# Patient Record
Sex: Male | Born: 1935 | Race: White | Hispanic: No | Marital: Married | Smoking: Never smoker
Health system: Southern US, Community
[De-identification: ages and names within clinical notes are randomized; demographics above are authoritative.]

## PROBLEM LIST (undated history)

## (undated) DIAGNOSIS — I10 Essential (primary) hypertension: Secondary | ICD-10-CM

## (undated) DIAGNOSIS — N400 Enlarged prostate without lower urinary tract symptoms: Secondary | ICD-10-CM

## (undated) DIAGNOSIS — J329 Chronic sinusitis, unspecified: Secondary | ICD-10-CM

## (undated) DIAGNOSIS — I4891 Unspecified atrial fibrillation: Secondary | ICD-10-CM

## (undated) HISTORY — PX: CHOLECYSTECTOMY: SHX55

## (undated) HISTORY — PX: HERNIA REPAIR: SHX51

---

## 2013-12-15 ENCOUNTER — Encounter (HOSPITAL_BASED_OUTPATIENT_CLINIC_OR_DEPARTMENT_OTHER): Payer: Self-pay

## 2013-12-15 ENCOUNTER — Emergency Department (HOSPITAL_BASED_OUTPATIENT_CLINIC_OR_DEPARTMENT_OTHER)
Admission: EM | Admit: 2013-12-15 | Discharge: 2013-12-15 | Disposition: A | Discharge status: Home / Self Care | Payer: Medicare HMO | Attending: Emergency Medicine | Admitting: Emergency Medicine

## 2013-12-15 ENCOUNTER — Emergency Department (HOSPITAL_BASED_OUTPATIENT_CLINIC_OR_DEPARTMENT_OTHER): Payer: Medicare HMO

## 2013-12-15 DIAGNOSIS — W01198A Fall on same level from slipping, tripping and stumbling with subsequent striking against other object, initial encounter: Secondary | ICD-10-CM | POA: Insufficient documentation

## 2013-12-15 DIAGNOSIS — S301XXA Contusion of abdominal wall, initial encounter: Secondary | ICD-10-CM | POA: Diagnosis not present

## 2013-12-15 DIAGNOSIS — Z880 Allergy status to penicillin: Secondary | ICD-10-CM | POA: Diagnosis not present

## 2013-12-15 DIAGNOSIS — Y92015 Private garage of single-family (private) house as the place of occurrence of the external cause: Secondary | ICD-10-CM | POA: Diagnosis not present

## 2013-12-15 DIAGNOSIS — S3991XA Unspecified injury of abdomen, initial encounter: Secondary | ICD-10-CM | POA: Diagnosis present

## 2013-12-15 DIAGNOSIS — Y998 Other external cause status: Secondary | ICD-10-CM | POA: Diagnosis not present

## 2013-12-15 DIAGNOSIS — I10 Essential (primary) hypertension: Secondary | ICD-10-CM | POA: Insufficient documentation

## 2013-12-15 DIAGNOSIS — Z7901 Long term (current) use of anticoagulants: Secondary | ICD-10-CM | POA: Diagnosis not present

## 2013-12-15 DIAGNOSIS — W19XXXA Unspecified fall, initial encounter: Secondary | ICD-10-CM

## 2013-12-15 DIAGNOSIS — Y9389 Activity, other specified: Secondary | ICD-10-CM | POA: Diagnosis not present

## 2013-12-15 HISTORY — DX: Unspecified atrial fibrillation: I48.91

## 2013-12-15 HISTORY — DX: Essential (primary) hypertension: I10

## 2013-12-15 LAB — BASIC METABOLIC PANEL
Anion gap: 12 (ref 5–15)
BUN: 21 mg/dL (ref 6–23)
CALCIUM: 8.9 mg/dL (ref 8.4–10.5)
CO2: 23 mEq/L (ref 19–32)
CREATININE: 1.4 mg/dL — AB (ref 0.50–1.35)
Chloride: 103 mEq/L (ref 96–112)
GFR calc non Af Amer: 43 mL/min — ABNORMAL LOW (ref >90)
GFR, EST AFRICAN AMERICAN: 50 mL/min — AB (ref >90)
Glucose, Bld: 139 mg/dL — ABNORMAL HIGH (ref 70–99)
Potassium: 4.7 mEq/L (ref 3.7–5.3)
Sodium: 138 mEq/L (ref 137–147)

## 2013-12-15 LAB — CBC WITH DIFFERENTIAL/PLATELET
BASOS PCT: 0 % (ref 0–1)
Basophils Absolute: 0 10*3/uL (ref 0.0–0.1)
EOS ABS: 0 10*3/uL (ref 0.0–0.7)
Eosinophils Relative: 0 % (ref 0–5)
HEMATOCRIT: 35.3 % — AB (ref 39.0–52.0)
Hemoglobin: 12.3 g/dL — ABNORMAL LOW (ref 13.0–17.0)
Lymphocytes Relative: 5 % — ABNORMAL LOW (ref 12–46)
Lymphs Abs: 0.4 10*3/uL — ABNORMAL LOW (ref 0.7–4.0)
MCH: 36.1 pg — AB (ref 26.0–34.0)
MCHC: 34.8 g/dL (ref 30.0–36.0)
MCV: 103.5 fL — ABNORMAL HIGH (ref 78.0–100.0)
MONO ABS: 0.6 10*3/uL (ref 0.1–1.0)
Monocytes Relative: 9 % (ref 3–12)
NEUTROS PCT: 86 % — AB (ref 43–77)
Neutro Abs: 5.8 10*3/uL (ref 1.7–7.7)
Platelets: 96 10*3/uL — ABNORMAL LOW (ref 150–400)
RBC: 3.41 MIL/uL — ABNORMAL LOW (ref 4.22–5.81)
RDW: 13.3 % (ref 11.5–15.5)
WBC: 6.8 10*3/uL (ref 4.0–10.5)

## 2013-12-15 LAB — PROTIME-INR
INR: 2.61 — ABNORMAL HIGH (ref 0.00–1.49)
Prothrombin Time: 27.9 seconds — ABNORMAL HIGH (ref 11.6–15.2)

## 2013-12-15 MED ORDER — SODIUM CHLORIDE 0.9 % IV BOLUS (SEPSIS)
500.0000 mL | Freq: Once | INTRAVENOUS | Status: AC
  Administered 2013-12-15: 500 mL via INTRAVENOUS

## 2013-12-15 MED ORDER — IOHEXOL 300 MG/ML  SOLN
80.0000 mL | Freq: Once | INTRAMUSCULAR | Status: AC | PRN
  Administered 2013-12-15: 80 mL via INTRAVENOUS

## 2013-12-15 MED ORDER — IOHEXOL 300 MG/ML  SOLN: 100.0000 mL | Freq: Once | INTRAMUSCULAR | Status: DC | PRN

## 2013-12-15 MED ORDER — SODIUM CHLORIDE 0.9 % IV BOLUS (SEPSIS)
1000.0000 mL | Freq: Once | INTRAVENOUS | Status: AC
  Administered 2013-12-15: 1000 mL via INTRAVENOUS

## 2013-12-15 NOTE — ED Provider Notes (Signed)
CSN: [id number]     Arrival date & time 12/15/13  1811 History  This chart was scribed for Audree CamelScott T Elester Apodaca, MD by Gwenyth Oberatherine Macek, ED Scribe. This patient was seen in room MH02/MH02 and the patient's care was started at 7:06 PM.    Chief Complaint  Patient presents with  . Fall   The history is provided by the patient. No language interpreter was used.    HPI Comments: Kyle Wheeler is a 80 y.o. male who presents to the Emergency Department complaining of gradually improving, 3/10, left upper quadrant abdominal pain that started 3 hours ago. Pt was moving something when he lost his balance and fell in his garage onto his left side. He did not hit his head or lose consciousness. Pt takes Warfarin and came to ED per PCP's recommendation. His last coumadin measurement was 2.5 He denies hematuria as an associated symptom.  Past Medical History  Diagnosis Date  . Hypertension   . A-fib    Past Surgical History  Procedure Laterality Date  . Cholecystectomy    . Hernia repair     No family history on file. History  Substance Use Topics  . Smoking status: Never Smoker   . Smokeless tobacco: Not on file  . Alcohol Use: No    Review of Systems  Cardiovascular: Negative for chest pain.  Gastrointestinal: Positive for abdominal pain. Negative for vomiting.  Genitourinary: Negative for hematuria.  Musculoskeletal: Negative for back pain.  Skin: Negative for wound.  All other systems reviewed and are negative.   Allergies  Penicillins and Sulfa antibiotics  Home Medications   Prior to Admission medications   Medication Sig Start Date End Date Taking? Authorizing Provider  LORAZEPAM PO Take by mouth.   Yes Historical Provider, MD  UNKNOWN TO PATIENT BP meds x 2   Yes Historical Provider, MD  WARFARIN SODIUM PO Take by mouth.   Yes Historical Provider, MD   BP 161/74 mmHg  Pulse 96  Temp(Src) 98.3 F (36.8 C) (Oral)  Resp 18  Ht 5\' 8"  (1.727 m)  Wt 240 lb (108.863 kg)  BMI  36.50 kg/m2  SpO2 96% Physical Exam  Constitutional: He appears well-developed and well-nourished. No distress.  HENT:  Head: Normocephalic and atraumatic.  Neck: Neck supple. No tracheal deviation present.  Cardiovascular: Normal rate, regular rhythm and normal heart sounds.   Pulmonary/Chest: Effort normal and breath sounds normal. No respiratory distress. He exhibits no tenderness.  Abdominal: He exhibits no distension. There is no tenderness. There is no rebound and no guarding.  Musculoskeletal:  No focal abdominal tenderness; no ecchymosis; no tenderness around his ribs; no chest tenderness  Skin: Skin is warm and dry.  Psychiatric: He has a normal mood and affect. His behavior is normal.  Nursing note and vitals reviewed.   ED Course  Procedures (including critical care time) DIAGNOSTIC STUDIES: Oxygen Saturation is 96% on RA, normal by my interpretation.    COORDINATION OF CARE: 7:10 PM Discussed treatment plan which includes CT Abdomen/Pelvis and labwork and pt agreed to plan.  Labs Review Labs Reviewed  CBC WITH DIFFERENTIAL - Abnormal; Notable for the following:    RBC 3.41 (*)    Hemoglobin 12.3 (*)    HCT 35.3 (*)    MCV 103.5 (*)    MCH 36.1 (*)    Platelets 96 (*)    Neutrophils Relative % 86 (*)    Lymphocytes Relative 5 (*)    Lymphs Abs 0.4 (*)  All other components within normal limits  BASIC METABOLIC PANEL - Abnormal; Notable for the following:    Glucose, Bld 139 (*)    Creatinine, Ser 1.40 (*)    GFR calc non Af Amer 43 (*)    GFR calc Af Amer 50 (*)    All other components within normal limits  PROTIME-INR - Abnormal; Notable for the following:    Prothrombin Time 27.9 (*)    INR 2.61 (*)    All other components within normal limits    Imaging Review Ct Abdomen Pelvis W Contrast  12/15/2013   CLINICAL DATA:  Left upper quadrant abdominal pain after fall.  EXAM: CT ABDOMEN AND PELVIS WITH CONTRAST  TECHNIQUE: Multidetector CT imaging of  the abdomen and pelvis was performed using the standard protocol following bolus administration of intravenous contrast.  CONTRAST:  80mL OMNIPAQUE IOHEXOL 300 MG/ML  SOLN  COMPARISON:  None.  FINDINGS: Multilevel degenerative disc disease is noted in the lumbar spine. Visualized lung bases appear normal.  Status post cholecystectomy. The liver, spleen and pancreas appear normal. Adrenal glands appear normal. Mild bilateral renal atrophy is noted with multiple cysts. Nonobstructive calculus is noted in lower pole collecting system of right kidney. No hydronephrosis is noted. No ureteral calculi are noted. There is no evidence of bowel obstruction. No abnormal fluid collection is noted. Abdominal aorta appears normal. Mild sigmoid diverticulosis is noted without inflammation. Urinary bladder appears normal. Mild prostatic enlargement is noted. No significant adenopathy is noted. Small fat containing periumbilical hernia is noted.  IMPRESSION: Small nonobstructive right renal calculus. Bilateral renal cysts are noted. No hydronephrosis or renal obstruction is noted.  Mild sigmoid diverticulosis is noted without inflammation.  Small fat containing paraumbilical hernia is noted.  Mild prostatic enlargement.   Electronically Signed   By: Roque LiasJames  Green M.D.   On: 12/15/2013 21:12     EKG Interpretation None      MDM   Final diagnoses:  Fall, initial encounter  Abdominal contusion, initial encounter    Patient's CT is unremarkable. No acute injury. Pain improving on it's own. INR is therapeutic, is anemic but suspect this is baseline instead of acute blood loss. No head injury. Will d/c home with return precautions.   I personally performed the services described in this documentation, which was scribed in my presence. The recorded information has been reviewed and is accurate.     Audree CamelScott T Amoree Newlon, MD 12/16/13 [phone]

## 2013-12-15 NOTE — ED Notes (Signed)
Slipped/fell in garage approx 4pm-pain to left abd that hit "the cement"-denies LOC and head injury

## 2013-12-15 NOTE — Discharge Instructions (Signed)
Contusion A contusion is a deep bruise. Contusions are the result of an injury that caused bleeding under the skin. The contusion may turn blue, purple, or yellow. Minor injuries will give you a painless contusion, but more severe contusions may stay painful and swollen for a few weeks.  CAUSES  A contusion is usually caused by a blow, trauma, or direct force to an area of the body. SYMPTOMS   Swelling and redness of the injured area.  Bruising of the injured area.  Tenderness and soreness of the injured area.  Pain. DIAGNOSIS  The diagnosis can be made by taking a history and physical exam. An X-ray, CT scan, or MRI may be needed to determine if there were any associated injuries, such as fractures. TREATMENT  Specific treatment will depend on what area of the body was injured. In general, the best treatment for a contusion is resting, icing, elevating, and applying cold compresses to the injured area. Over-the-counter medicines may also be recommended for pain control. Ask your caregiver what the best treatment is for your contusion. HOME CARE INSTRUCTIONS   Put ice on the injured area.  Put ice in a plastic bag.  Place a towel between your skin and the bag.  Leave the ice on for 15-20 minutes, 3-4 times a day, or as directed by your health care provider.  Only take over-the-counter or prescription medicines for pain, discomfort, or fever as directed by your caregiver. Your caregiver may recommend avoiding anti-inflammatory medicines (aspirin, ibuprofen, and naproxen) for 48 hours because these medicines may increase bruising.  Rest the injured area.  If possible, elevate the injured area to reduce swelling. SEEK IMMEDIATE MEDICAL CARE IF:   You have increased bruising or swelling.  You have pain that is getting worse.  Your swelling or pain is not relieved with medicines. MAKE SURE YOU:   Understand these instructions.  Will watch your condition.  Will get help right  away if you are not doing well or get worse. Document Released: 10/24/2004 Document Revised: 01/19/2013 Document Reviewed: 11/19/2010 [facility] Patient Information 2015 [place], [place]. This information is not intended to replace advice given to you by your health care provider. Make sure you discuss any questions you have with your health care provider.     Blunt Abdominal Trauma A blunt injury to the abdomen can cause pain. The pain is most likely from bruising and stretching of your muscles. This pain is often made worse with movement. Most often these injuries are not serious and get better within 1 week with rest and mild pain medicine. However, internal organs (liver, spleen, kidneys) can be injured with blunt trauma. If you do not get better or if you get worse, further examination may be needed. Continue with your regular daily activities, but avoid any strenuous activities until your pain is improved. If your stomach is upset, stick to a clear liquid diet and slowly advance to solid food.  SEEK IMMEDIATE MEDICAL CARE IF:   You develop increasing pain, nausea, or repeated vomiting.  You develop chest pain or breathing difficulty.  You develop blood in the urine, vomit, or stool.  You develop weakness, fainting, fever, or other serious complaints. Document Released: 02/22/2004 Document Revised: 04/08/2011 Document Reviewed: 06/09/2008 [facility] Patient Information 2015 [place], [place]. This information is not intended to replace advice given to you by your health care provider. Make sure you discuss any questions you have with your health care provider.

## 2013-12-15 NOTE — ED Notes (Signed)
C/o fall lost balance grabbed cart that rolled w him hitting left side  Denies loc or other complaint

## 2015-11-09 IMAGING — CT CT ABD-PELV W/ CM
2 of 5 series · 17 of 46 positions shown, 19 images · IV contrast (APPLIED)
Comparison: None.

CLINICAL DATA: Left upper quadrant abdominal pain after fall.

EXAM:
CT ABDOMEN AND PELVIS WITH CONTRAST
TECHNIQUE: Multidetector CT imaging of the abdomen and pelvis was performed
using the standard protocol following bolus administration of
intravenous contrast.
CONTRAST:  80mL OMNIPAQUE IOHEXOL 300 MG/ML  SOLN

[Series 2: abd/pelvis 5.0 b31f · axial · 0.94mm/px · z∈[-522,-102]mm · 14 of 94 slices shown, 16 images]
[im 5/94  soft-tissue]
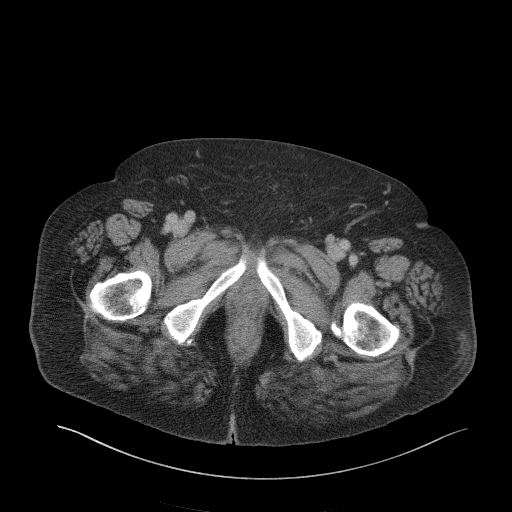
[im 5/94  bone]
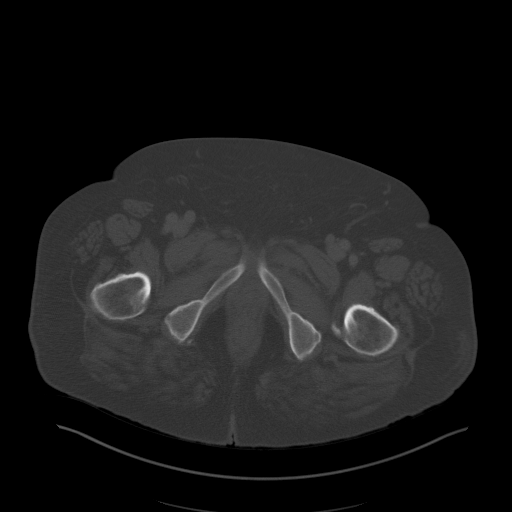
[im 14/94  soft-tissue]
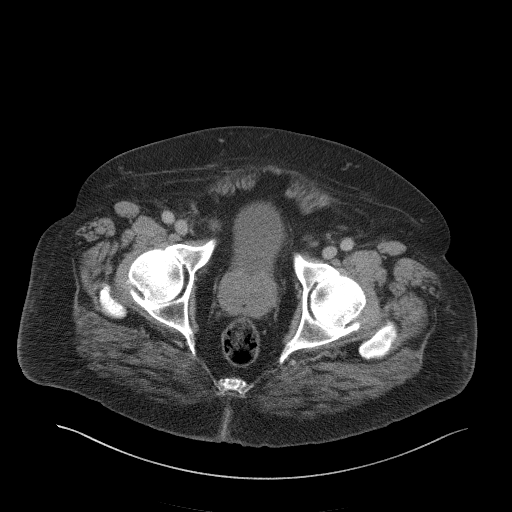
[im 19/94  soft-tissue]
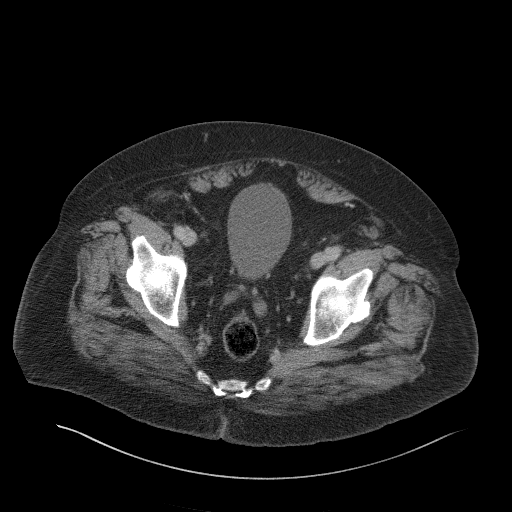
[im 24/94  soft-tissue]
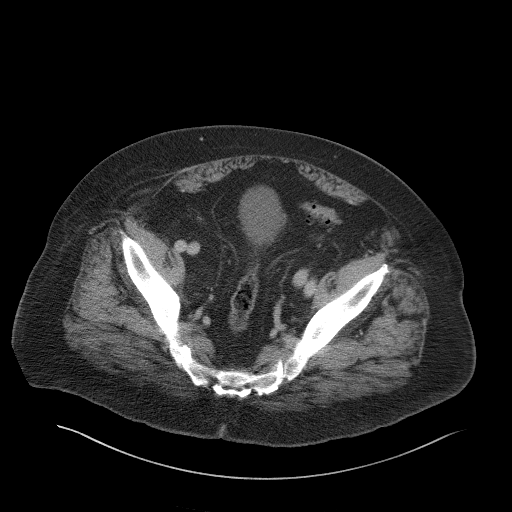
[im 33/94  soft-tissue]
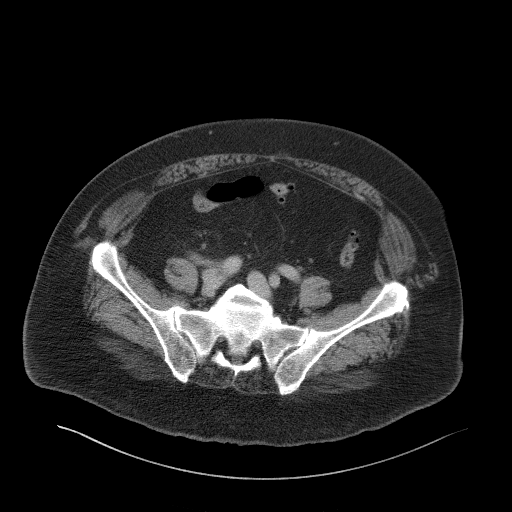
[im 38/94  soft-tissue]
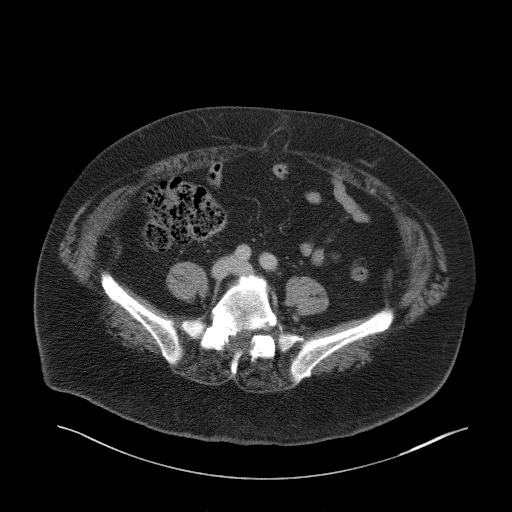
[im 42/94  soft-tissue]
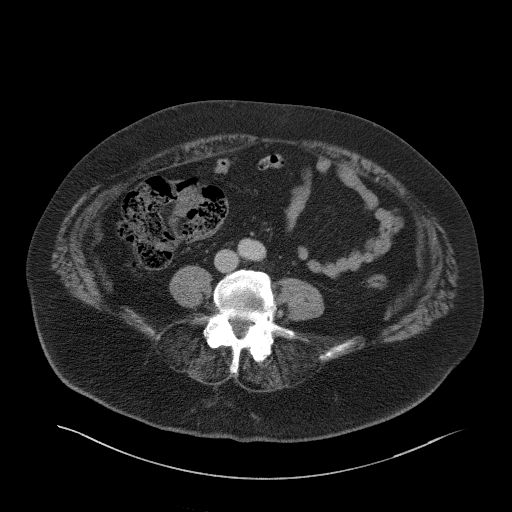
[im 52/94  soft-tissue]
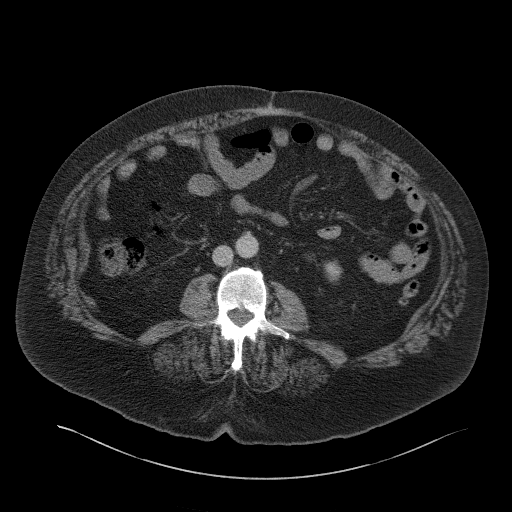
[im 56/94  soft-tissue]
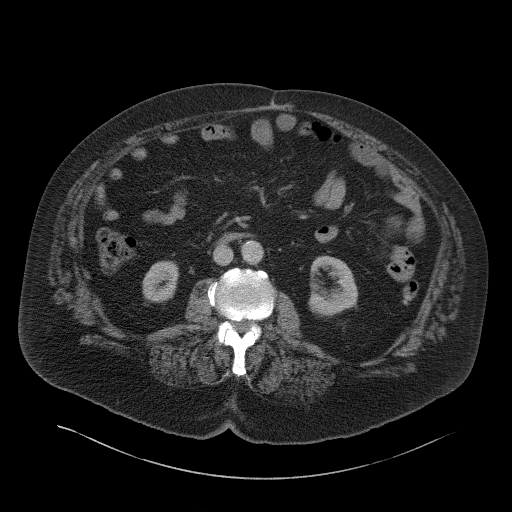
[im 56/94  bone]
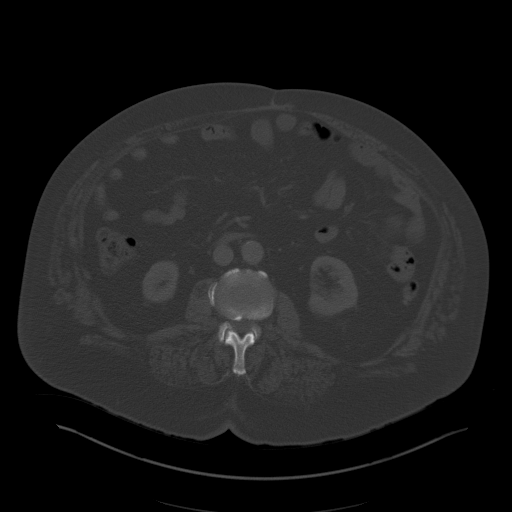
[im 61/94  soft-tissue]
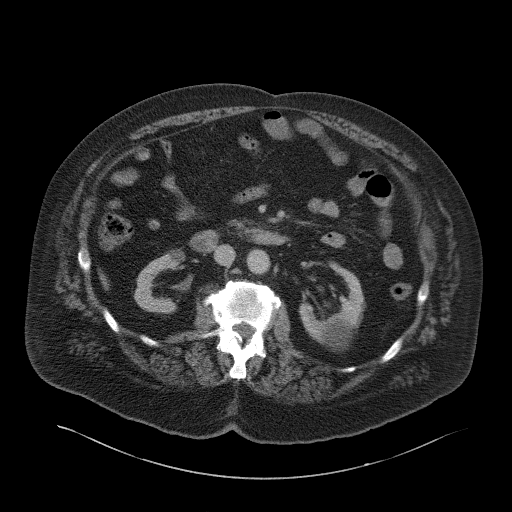
[im 70/94  soft-tissue]
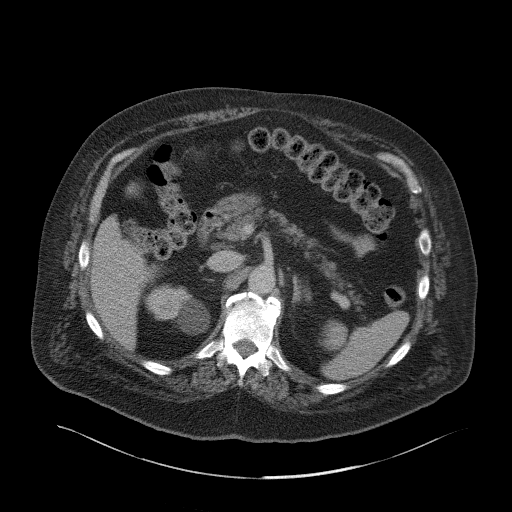
[im 75/94  soft-tissue]
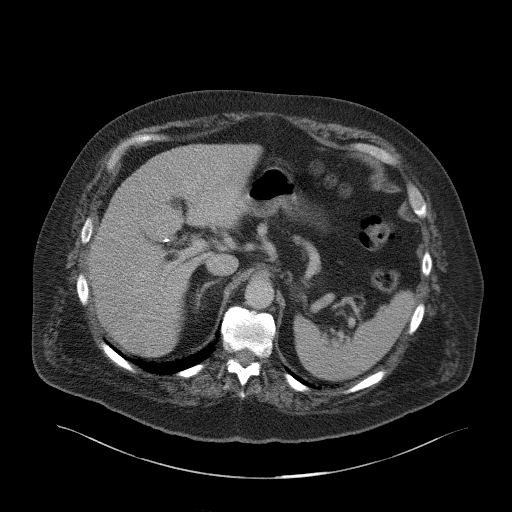
[im 80/94  soft-tissue]
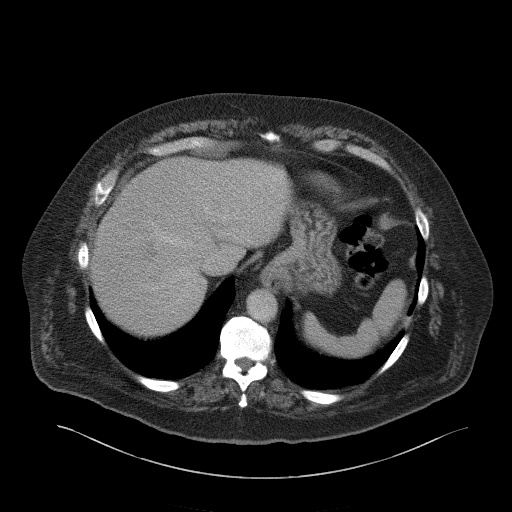
[im 89/94  soft-tissue]
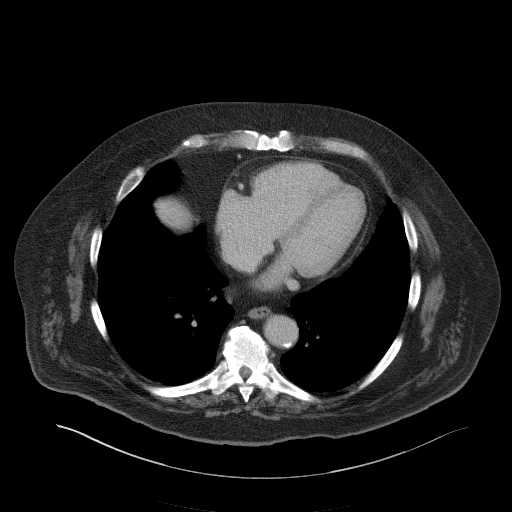

[Series 5: abd/pelvis 3.0 coronal · coronal · 0.97mm/px · 3 of 104 slices shown]
[im 35/104  soft-tissue]
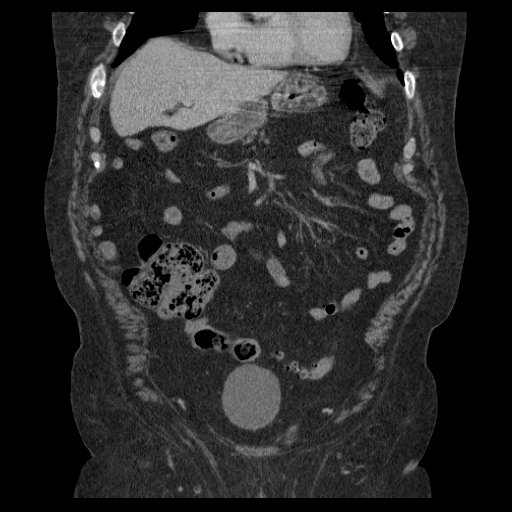
[im 46/104  soft-tissue]
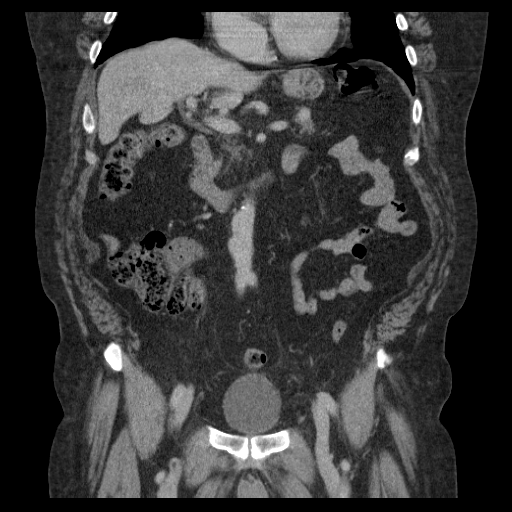
[im 58/104  soft-tissue]
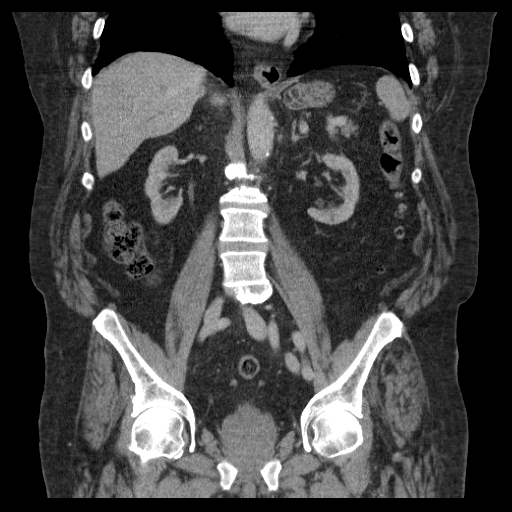

[17 of 46 positions shown; findings below may reference images not displayed]

FINDINGS: Multilevel degenerative disc disease is noted in the lumbar spine.
Visualized lung bases appear normal.

Status post cholecystectomy. The liver, spleen and pancreas appear
normal. Adrenal glands appear normal. Mild bilateral renal atrophy
is noted with multiple cysts. Nonobstructive calculus is noted in
lower pole collecting system of right kidney. No hydronephrosis is
noted. No ureteral calculi are noted. There is no evidence of bowel
obstruction. No abnormal fluid collection is noted. Abdominal aorta
appears normal. Mild sigmoid diverticulosis is noted without
inflammation. Urinary bladder appears normal. Mild prostatic
enlargement is noted. No significant adenopathy is noted. Small fat
containing periumbilical hernia is noted.
IMPRESSION: Small nonobstructive right renal calculus. Bilateral renal cysts are
noted. No hydronephrosis or renal obstruction is noted.

Mild sigmoid diverticulosis is noted without inflammation.

Small fat containing paraumbilical hernia is noted.

Mild prostatic enlargement.

## 2016-02-09 ENCOUNTER — Emergency Department (HOSPITAL_BASED_OUTPATIENT_CLINIC_OR_DEPARTMENT_OTHER)
Admission: EM | Admit: 2016-02-09 | Discharge: 2016-02-09 | Disposition: A | Discharge status: Home / Self Care | Payer: Medicare HMO | Attending: Emergency Medicine | Admitting: Emergency Medicine

## 2016-02-09 ENCOUNTER — Emergency Department (HOSPITAL_BASED_OUTPATIENT_CLINIC_OR_DEPARTMENT_OTHER): Payer: Medicare HMO

## 2016-02-09 ENCOUNTER — Encounter (HOSPITAL_BASED_OUTPATIENT_CLINIC_OR_DEPARTMENT_OTHER): Payer: Self-pay | Admitting: Emergency Medicine

## 2016-02-09 DIAGNOSIS — S199XXA Unspecified injury of neck, initial encounter: Secondary | ICD-10-CM | POA: Diagnosis present

## 2016-02-09 DIAGNOSIS — S0990XA Unspecified injury of head, initial encounter: Secondary | ICD-10-CM | POA: Insufficient documentation

## 2016-02-09 DIAGNOSIS — W0110XA Fall on same level from slipping, tripping and stumbling with subsequent striking against unspecified object, initial encounter: Secondary | ICD-10-CM | POA: Insufficient documentation

## 2016-02-09 DIAGNOSIS — S161XXA Strain of muscle, fascia and tendon at neck level, initial encounter: Secondary | ICD-10-CM

## 2016-02-09 DIAGNOSIS — Y92000 Kitchen of unspecified non-institutional (private) residence as  the place of occurrence of the external cause: Secondary | ICD-10-CM | POA: Diagnosis not present

## 2016-02-09 DIAGNOSIS — Y999 Unspecified external cause status: Secondary | ICD-10-CM | POA: Insufficient documentation

## 2016-02-09 DIAGNOSIS — Y939 Activity, unspecified: Secondary | ICD-10-CM | POA: Diagnosis not present

## 2016-02-09 DIAGNOSIS — W19XXXA Unspecified fall, initial encounter: Secondary | ICD-10-CM

## 2016-02-09 DIAGNOSIS — I10 Essential (primary) hypertension: Secondary | ICD-10-CM | POA: Insufficient documentation

## 2016-02-09 NOTE — ED Notes (Signed)
Reports tripped going in front door and fell face first.  No visible signs of injury  But complains of intermittent left temporal pain that comes and goes.

## 2016-02-09 NOTE — ED Provider Notes (Signed)
Emergency Department Provider Note   I have reviewed the triage vital signs and the nursing notes.   HISTORY  Chief Complaint Fall   HPI Kyle Wheeler is a 83 y.o. male with PMH of HTN and a-fib on Coumadin since to the emergency department for evaluation after mechanical fall. The patient was walking through a doorway when he tripped and fell 3 days ago. He reports some swelling to the left side of his face that is decreased over the period of time. He presented today with some continued left sided pain. He has a states that his eyes felt "weak" with some mild blurry vision. He states he was over in the CT scanner and felt better afterwards. No nausea or vomiting since the incident. Patient denies pain in the hips, arms, legs, or torso. No additional falls. He has been compliant with his Coumadin and is following with his primary care physician regularly. No exacerbating or alleviating factors.   Past Medical History:  Diagnosis Date  . A-fib (HCC)   . Hypertension     There are no active problems to display for this patient.   Past Surgical History:  Procedure Laterality Date  . CHOLECYSTECTOMY    . HERNIA REPAIR      Current Outpatient Rx  . Order #: [id number] Class: Historical Med  . Order #: [id number] Class: Historical Med  . Order #: [id number] Class: Historical Med  . Order #: [id number] Class: Historical Med    Allergies Penicillins and Sulfa antibiotics  No family history on file.  Social History Social History  Substance Use Topics  . Smoking status: Never Smoker  . Smokeless tobacco: Never Used  . Alcohol use No    Review of Systems  Constitutional: No fever/chills Eyes: No visual changes. ENT: No sore throat. Cardiovascular: Denies chest pain. Respiratory: Denies shortness of breath. Gastrointestinal: No abdominal pain.  No nausea, no vomiting.  No diarrhea.  No constipation. Genitourinary: Negative for dysuria. Musculoskeletal: Negative for back  pain. Positive left lateral face pain and HA.  Skin: Negative for rash. Neurological: Negative for focal weakness or numbness.  10-point ROS otherwise negative.  ____________________________________________   PHYSICAL EXAM:  VITAL SIGNS: ED Triage Vitals  Enc Vitals Group     BP 02/09/16 1450 132/57     Pulse Rate 02/09/16 1450 78     Resp 02/09/16 1450 18     Temp 02/09/16 1450 97.7 F (36.5 C)     Temp Source 02/09/16 1450 Oral     SpO2 02/09/16 1450 100 %     Weight 02/09/16 1450 240 lb (108.9 kg)     Pain Score 02/09/16 1453 5   Constitutional: Alert and oriented. Well appearing and in no acute distress. Eyes: Conjunctivae are normal. PERRL. Head: Atraumatic Nose: No congestion/rhinnorhea. Mouth/Throat: Mucous membranes are moist.  Oropharynx non-erythematous. Neck: No stridor. No cervical spine tenderness to palpation. Cardiovascular: Normal rate, regular rhythm. Good peripheral circulation. Grossly normal heart sounds.   Respiratory: Normal respiratory effort.  No retractions. Lungs CTAB. Gastrointestinal: Soft and nontender. No distention.  Musculoskeletal: No lower extremity tenderness nor edema. No gross deformities of extremities. Neurologic:  Normal speech and language. No gross focal neurologic deficits are appreciated.  Skin:  Skin is warm, dry and intact. No rash noted. Psychiatric: Mood and affect are normal. Speech and behavior are normal.  ____________________________________________  RADIOLOGY  Ct Head Wo Contrast  Result Date: 02/09/2016 CLINICAL DATA:  Fall EXAM: CT HEAD WITHOUT CONTRAST CT CERVICAL SPINE WITHOUT CONTRAST TECHNIQUE:  Multidetector CT imaging of the head and cervical spine was performed following the standard protocol without intravenous contrast. Multiplanar CT image reconstructions of the cervical spine were also generated. COMPARISON:  None. FINDINGS: CT HEAD FINDINGS Brain: Mild global atrophy. Chronic ischemic changes in the  periventricular white matter. Lacunar infarct in the right basal ganglia. No mass effect, midline shift, or acute hemorrhage. Vascular: No hyperdense vessel or unexpected calcification. Skull: Normal. Negative for fracture or focal lesion. Sinuses/Orbits: No acute finding. Other: None. CT CERVICAL SPINE FINDINGS Alignment: Normal. Skull base and vertebrae: No fracture.  No dislocation. Soft tissues and spinal canal: No obvious spinal hematoma. 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 Upper chest: Negative. Other: Noncontributory. IMPRESSION: No acute intracranial pathology. Chronic ischemic changes and atrophy are noted. No evidence of acute cervical spine injury. 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 Electronically Signed   By: Jolaine Click M.D.   On: 02/09/2016 16:01   Ct Cervical Spine Wo Contrast  Result Date: 02/09/2016 CLINICAL DATA:  Fall EXAM: CT HEAD WITHOUT CONTRAST CT CERVICAL SPINE WITHOUT CONTRAST TECHNIQUE: Multidetector CT imaging of the head and cervical spine was performed following the standard protocol without intravenous contrast. Multiplanar CT image reconstructions of the cervical spine were also generated. COMPARISON:  None. FINDINGS: CT HEAD FINDINGS Brain: Mild global atrophy. Chronic ischemic changes in the periventricular white matter. Lacunar infarct in the right basal ganglia. No mass effect, midline shift, or acute hemorrhage. Vascular: No hyperdense vessel or unexpected calcification. Skull: Normal. Negative for fracture or focal lesion. Sinuses/Orbits: No acute finding. Other: None. CT CERVICAL SPINE FINDINGS Alignment: Normal. Skull base and vertebrae: No fracture.  No dislocation. Soft tissues and spinal canal: No obvious spinal hematoma. 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 Upper  chest: Negative. Other: Noncontributory. IMPRESSION: No acute intracranial pathology. Chronic ischemic changes and atrophy are noted. No evidence of acute cervical spine injury. 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 Electronically Signed   By: Jolaine Click M.D.   On: 02/09/2016 16:01    ____________________________________________   PROCEDURES  Procedure(s) performed:   Procedures  None ____________________________________________   INITIAL IMPRESSION / ASSESSMENT AND PLAN / ED COURSE  Pertinent labs & imaging results that were available during my care of the patient were reviewed by me and considered in my medical decision making (see chart for details).  Patient resents to the emergency department for evaluation after mechanical fall. He is on Coumadin for A. fib. Fall occurred 3 days prior. CT scan of the head and cervical spine are negative for acute process. Patient is feeling improved in terms of his vision symptoms and headache. He will follow with his primary care physician and his optometrist in the coming week.   At this time, I do not feel there is any life-threatening condition present. I have reviewed and discussed all results (EKG, imaging, lab, urine as appropriate), exam findings with patient. I have reviewed nursing notes and appropriate previous records.  I feel the patient is safe to be discharged home without further emergent workup. Discussed usual and customary return precautions. Patient and family (if present) verbalize understanding and are comfortable with this plan.  Patient will follow-up with their primary care provider. If they do not have a primary care provider, information for follow-up has been provided to them. All questions  have been answered.  ____________________________________________  FINAL CLINICAL IMPRESSION(S) / ED DIAGNOSES  Final diagnoses:  Fall, initial encounter  Injury of head,  initial encounter  Strain of neck muscle, initial encounter     MEDICATIONS GIVEN DURING THIS VISIT:  None  NEW OUTPATIENT MEDICATIONS STARTED DURING THIS VISIT:  None   Note:  This document was prepared using Dragon voice recognition software and may include unintentional dictation errors.  Alona Bene, MD Emergency Medicine   Maia Plan, MD 02/10/16 [phone]

## 2016-02-09 NOTE — ED Triage Notes (Addendum)
Pt reports he tripped and fell on Tuesday at home in the doorway, hitting his head on kitchen floor. Denies LOC. Pt reports a hematoma appeared over L eye but has since subsided. Pt went to PCP today due to L side head pain and was sent here for further eval. Pt CAO at this time, denies trouble speaking, N/V, and weakness in arms or legs. Pt takes warfarin.

## 2016-02-09 NOTE — Discharge Instructions (Signed)
You were seen in the Emergency Department (ED) today for a head injury.  Based on your evaluation, you may have sustained a concussion (or bruise) to your brain.  If you had a CT scan done, it did not show any evidence of serious injury or bleeding.    Symptoms to expect from a concussion include nausea, mild to moderate headache, difficulty concentrating or sleeping, and mild lightheadedness.  These symptoms should improve over the next few days to weeks, but it may take many weeks before you feel back to normal.  Return to the emergency department or follow-up with your primary care doctor if your symptoms are not improving over this time.  Signs of a more serious head injury include vomiting, severe headache, excessive sleepiness or confusion, and weakness or numbness in your face, arms or legs.  Return immediately to the Emergency Department if you experience any of these more concerning symptoms.    Rest, avoid strenuous physical or mental activity, and avoid activities that could potentially result in another head injury until all your symptoms from this head injury are completely resolved for at least 2-3 weeks.  You may take ibuprofen or acetaminophen over the counter according to label instructions for mild headache or scalp soreness.  

## 2016-08-23 ENCOUNTER — Emergency Department (HOSPITAL_BASED_OUTPATIENT_CLINIC_OR_DEPARTMENT_OTHER): Payer: Medicare HMO

## 2016-08-23 ENCOUNTER — Encounter (HOSPITAL_BASED_OUTPATIENT_CLINIC_OR_DEPARTMENT_OTHER): Payer: Self-pay

## 2016-08-23 DIAGNOSIS — Z79899 Other long term (current) drug therapy: Secondary | ICD-10-CM | POA: Insufficient documentation

## 2016-08-23 DIAGNOSIS — I480 Paroxysmal atrial fibrillation: Secondary | ICD-10-CM | POA: Diagnosis not present

## 2016-08-23 DIAGNOSIS — J029 Acute pharyngitis, unspecified: Secondary | ICD-10-CM | POA: Diagnosis not present

## 2016-08-23 DIAGNOSIS — I10 Essential (primary) hypertension: Secondary | ICD-10-CM | POA: Diagnosis not present

## 2016-08-23 DIAGNOSIS — R0989 Other specified symptoms and signs involving the circulatory and respiratory systems: Secondary | ICD-10-CM | POA: Diagnosis not present

## 2016-08-23 DIAGNOSIS — R05 Cough: Secondary | ICD-10-CM | POA: Diagnosis not present

## 2016-08-23 NOTE — ED Triage Notes (Signed)
Pt states he feels like a piece of food is stuck on the left side, points to his neck, going on for the last two days, is able to eat and drink without issue, does not recall a choking episode, no fevers at home

## 2016-08-24 ENCOUNTER — Emergency Department (HOSPITAL_BASED_OUTPATIENT_CLINIC_OR_DEPARTMENT_OTHER)
Admission: EM | Admit: 2016-08-24 | Discharge: 2016-08-24 | Disposition: A | Discharge status: Home / Self Care | Payer: Medicare HMO | Attending: Emergency Medicine | Admitting: Emergency Medicine

## 2016-08-24 ENCOUNTER — Emergency Department (HOSPITAL_BASED_OUTPATIENT_CLINIC_OR_DEPARTMENT_OTHER): Payer: Medicare HMO

## 2016-08-24 DIAGNOSIS — R0989 Other specified symptoms and signs involving the circulatory and respiratory systems: Secondary | ICD-10-CM

## 2016-08-24 DIAGNOSIS — R059 Cough, unspecified: Secondary | ICD-10-CM

## 2016-08-24 DIAGNOSIS — I480 Paroxysmal atrial fibrillation: Secondary | ICD-10-CM

## 2016-08-24 DIAGNOSIS — R05 Cough: Secondary | ICD-10-CM

## 2016-08-24 DIAGNOSIS — Z7901 Long term (current) use of anticoagulants: Secondary | ICD-10-CM

## 2016-08-24 HISTORY — DX: Chronic sinusitis, unspecified: J32.9

## 2016-08-24 HISTORY — DX: Benign prostatic hyperplasia without lower urinary tract symptoms: N40.0

## 2016-08-24 MED ORDER — DEXAMETHASONE 6 MG PO TABS
10.0000 mg | ORAL_TABLET | Freq: Once | ORAL | Status: AC
  Administered 2016-08-24: 10 mg via ORAL
  Filled 2016-08-24: qty 1

## 2016-08-24 NOTE — ED Notes (Signed)
Pt ambulated to Room 4 with a steady gait, pt in NAD at this time.

## 2016-08-24 NOTE — Discharge Instructions (Signed)
Return if symptoms are getting worse. °

## 2016-08-24 NOTE — ED Provider Notes (Signed)
[facility] Note   CSN: [id number] Arrival date & time: 08/23/16  2310     History   Chief Complaint Chief Complaint  Patient presents with  . Sore Throat    HPI Kyle Wheeler is a 83 y.o. male.  The history is provided by the patient.  He complains of a feeling like there is something in the right side of his neck. Sensation has been there for about 10 days, and has been stable. He has not had any difficulty breathing or eating or swallowing. There is no pain and he denies fever. He is complaining of a cough which is productive of clear sputum. He has also been complaining of a lot of postnasal drainage. There is no dyspnea. Nothing makes this sensation better or worse. He came in tonight because he was worried that he would choke in his sleep. He is a nonsmoker. There has been no weight loss. Family feels his voice is slightly more raspy than normal.  Past Medical History:  Diagnosis Date  . A-fib (HCC)   . Chronic sinus infection   . Enlarged prostate   . Hypertension     There are no active problems to display for this patient.   Past Surgical History:  Procedure Laterality Date  . CHOLECYSTECTOMY    . HERNIA REPAIR         Home Medications    Prior to Admission medications   Medication Sig Start Date End Date Taking? Authorizing Provider  LORAZEPAM PO Take by mouth.    [provider]  UNKNOWN TO PATIENT BP meds x 2    [provider]  valsartan (DIOVAN) 40 MG tablet Take 40 mg by mouth daily.    [provider]  WARFARIN SODIUM PO Take by mouth.    [provider]    Family History No family history on file.  Social History Social History  Substance Use Topics  . Smoking status: Never Smoker  . Smokeless tobacco: Never Used  . Alcohol use No     Allergies   Penicillins and Sulfa antibiotics   Review of Systems Review of Systems  All other systems reviewed and are  negative.    Physical Exam Updated Vital Signs BP (!) 129/110 (BP Location: Left Arm)   Pulse (!) 104   Temp 97.9 F (36.6 C) (Oral)   Resp 18   Ht 5\' 10"  (1.778 m)   Wt 105.2 kg (232 lb)   SpO2 100%   BMI 33.29 kg/m   Physical Exam  Nursing note and vitals reviewed.  83 year old male, resting comfortably and in no acute distress. Vital signs are significant for hypertension and borderline tachycardia. Oxygen saturation is 100%, which is normal. Head is normocephalic and atraumatic. PERRLA, EOMI. Oropharynx is clear. Neck is nontender and supple without adenopathy or JVD. No foreign body or mass palpable. Back is nontender and there is no CVA tenderness. Lungs are clear without rales, wheezes, or rhonchi. Chest is nontender. Heart has regular rate and rhythm without murmur. Abdomen is soft, flat, nontender without masses or hepatosplenomegaly and peristalsis is normoactive. Extremities have no cyanosis or edema, full range of motion is present. Skin is warm and dry without rash. Neurologic: Mental status is normal, cranial nerves are intact, there are no motor or sensory deficits.  ED Treatments / Results  Labs (all labs ordered are listed, but only abnormal results are displayed) Labs Reviewed - No data to display  EKG  EKG Interpretation None       Radiology Dg Neck Soft Tissue  Result Date: 08/23/2016 CLINICAL DATA:  Throat pain. Feels like food stuck on the left side for 2 days. History of hypertension. EXAM: NECK SOFT TISSUES - 1+ VIEW COMPARISON:  CT cervical spine 02/09/2016 FINDINGS: There is no evidence of retropharyngeal soft tissue swelling or epiglottic enlargement. The cervical airway is unremarkable and no radio-opaque foreign body identified. Degenerative changes in the cervical spine. IMPRESSION: No radiopaque soft tissue foreign bodies identified. Electronically Signed   By: Burman NievesWilliam  Stevens M.D.   On: 08/23/2016 23:59   Dg Chest 2 View  Result  Date: 08/24/2016 CLINICAL DATA:  Foreign body sensation in the throat EXAM: CHEST  2 VIEW COMPARISON:  None. FINDINGS: The lungs are clear. The pulmonary vasculature is normal. Heart size is normal. Hilar and mediastinal contours are unremarkable. There is no pleural effusion. IMPRESSION: No active cardiopulmonary disease. Electronically Signed   By: Ellery Plunkaniel R Mitchell M.D.   On: 08/24/2016 02:24   Ct Soft Tissue Neck Wo Contrast  Result Date: 08/24/2016 CLINICAL DATA:  83 y/o M; foreign body sensation of left-sided neck for 2 days. EXAM: CT NECK WITHOUT CONTRAST TECHNIQUE: Multidetector CT imaging of the neck was performed following the standard protocol without intravenous contrast. COMPARISON:  02/09/2016 cervical spine CT. FINDINGS: Pharynx and larynx: Markedly mucosal thickening of the left lateral and posterior walls of the hypopharynx with effacement of the left vallecula. No radiopaque foreign body identified. Salivary glands: No inflammation, mass, or stone. Thyroid: Normal. Lymph nodes: None enlarged or abnormal density. Vascular: Negative. Limited intracranial: Negative. Visualized orbits: Negative. Mastoids and visualized paranasal sinuses: Clear. Skeleton: Cervical spondylosis greatest at the C6-7 level with there is severe disc space loss in posterior ossification of the longitudinal ligament with moderate to severe canal stenosis. Upper chest: Negative. Other: None. IMPRESSION: 1. Marked mucosal thickening of left lateral and posterior walls of the hypopharynx and effacement of left vallecula. No radiopaque foreign body identified. 2. Cervical spondylosis with ossified disc and posterior longitudinal ligament at the C6-7 level resulting in moderate to severe canal stenosis. Electronically Signed   By: Mitzi HansenLance  Furusawa-Stratton M.D.   On: 08/24/2016 02:37    Procedures Procedures (including critical care time)  Medications Ordered in ED Medications  dexamethasone (DECADRON) tablet 10 mg (10  mg Oral Given 08/24/16 0254)     Initial Impression / Assessment and Plan / ED Course  I have reviewed the triage vital signs and the nursing notes.  Pertinent labs & imaging results that were available during my care of the patient were reviewed by me and considered in my medical decision making (see chart for details).  Foreign body sensation in the throat with no findings on exam. Soft tissue neck x-ray had been ordered at triage and shows no radiopaque foreign body. He will be sent for CT scan. Also, because of cough, will get chest x-ray. Old records are reviewed, and he has no relevant past visits.  Chest x-ray is unremarkable. CT scan shows thickening of the mucosa with effacement of the vallecula. Cause is unclear. He is given a single dose of dexamethasone. He is referred to ENT for further evaluation. Return precautions discussed.  Final Clinical Impressions(s) / ED Diagnoses   Final diagnoses:  Cough  Foreign body sensation in throat  Long term (current) use of anticoagulants  Paroxysmal atrial fibrillation (HCC)    New Prescriptions New Prescriptions   No medications on file  Dione BoozeGlick, Taziah Difatta, MD 08/24/16 [phone]

## 2018-05-29 DEATH — deceased
